# Patient Record
Sex: Male | Born: 1990 | Hispanic: Yes | Marital: Single | Smoking: Never smoker
Health system: Southern US, Community
[De-identification: ages and names within clinical notes are randomized; demographics above are authoritative.]

## PROBLEM LIST (undated history)

## (undated) DIAGNOSIS — Z227 Latent tuberculosis: Secondary | ICD-10-CM

## (undated) HISTORY — DX: Latent tuberculosis: Z22.7

---

## 2019-12-02 ENCOUNTER — Telehealth: Payer: Self-pay

## 2019-12-09 ENCOUNTER — Other Ambulatory Visit: Payer: Self-pay

## 2019-12-09 ENCOUNTER — Ambulatory Visit (LOCAL_COMMUNITY_HEALTH_CENTER): Payer: Self-pay

## 2019-12-09 VITALS — Wt 216.0 lb

## 2019-12-09 DIAGNOSIS — Z111 Encounter for screening for respiratory tuberculosis: Secondary | ICD-10-CM

## 2019-12-09 NOTE — Progress Notes (Signed)
Patient in clinic d/t + QFT with employer, [employer]. Per patient when he first came to the [place] he had a +TB test and took INH x 9 months. States he tried to tell employer this but they still tested him.   Patient has no records of this. EPI completed and referred for CXR. Richmond Campbell, RN

## 2019-12-10 NOTE — Telephone Encounter (Signed)
TC from patient.  Informed eval is needed for +QFT. Appt scheduled Richmond Campbell, RN

## 2019-12-11 ENCOUNTER — Ambulatory Visit
Admission: RE | Admit: 2019-12-11 | Discharge: 2019-12-11 | Disposition: A | Discharge status: Home / Self Care | Payer: Self-pay | Attending: Family Medicine | Admitting: Family Medicine

## 2019-12-11 ENCOUNTER — Other Ambulatory Visit: Payer: Self-pay | Admitting: Family Medicine

## 2019-12-11 ENCOUNTER — Ambulatory Visit
Admission: RE | Admit: 2019-12-11 | Discharge: 2019-12-11 | Disposition: A | Discharge status: Home / Self Care | Payer: Self-pay | Source: Ambulatory Visit | Attending: Family Medicine | Admitting: Family Medicine

## 2019-12-11 DIAGNOSIS — R7612 Nonspecific reaction to cell mediated immunity measurement of gamma interferon antigen response without active tuberculosis: Secondary | ICD-10-CM

## 2021-03-03 IMAGING — CR DG CHEST 1V
1 series · 1 of 1 positions shown · non-contrast
Comparison: None.

CLINICAL DATA: Positive QuantiFERON gold test

EXAM:
CHEST  1 VIEW

[dg chest 1 view]
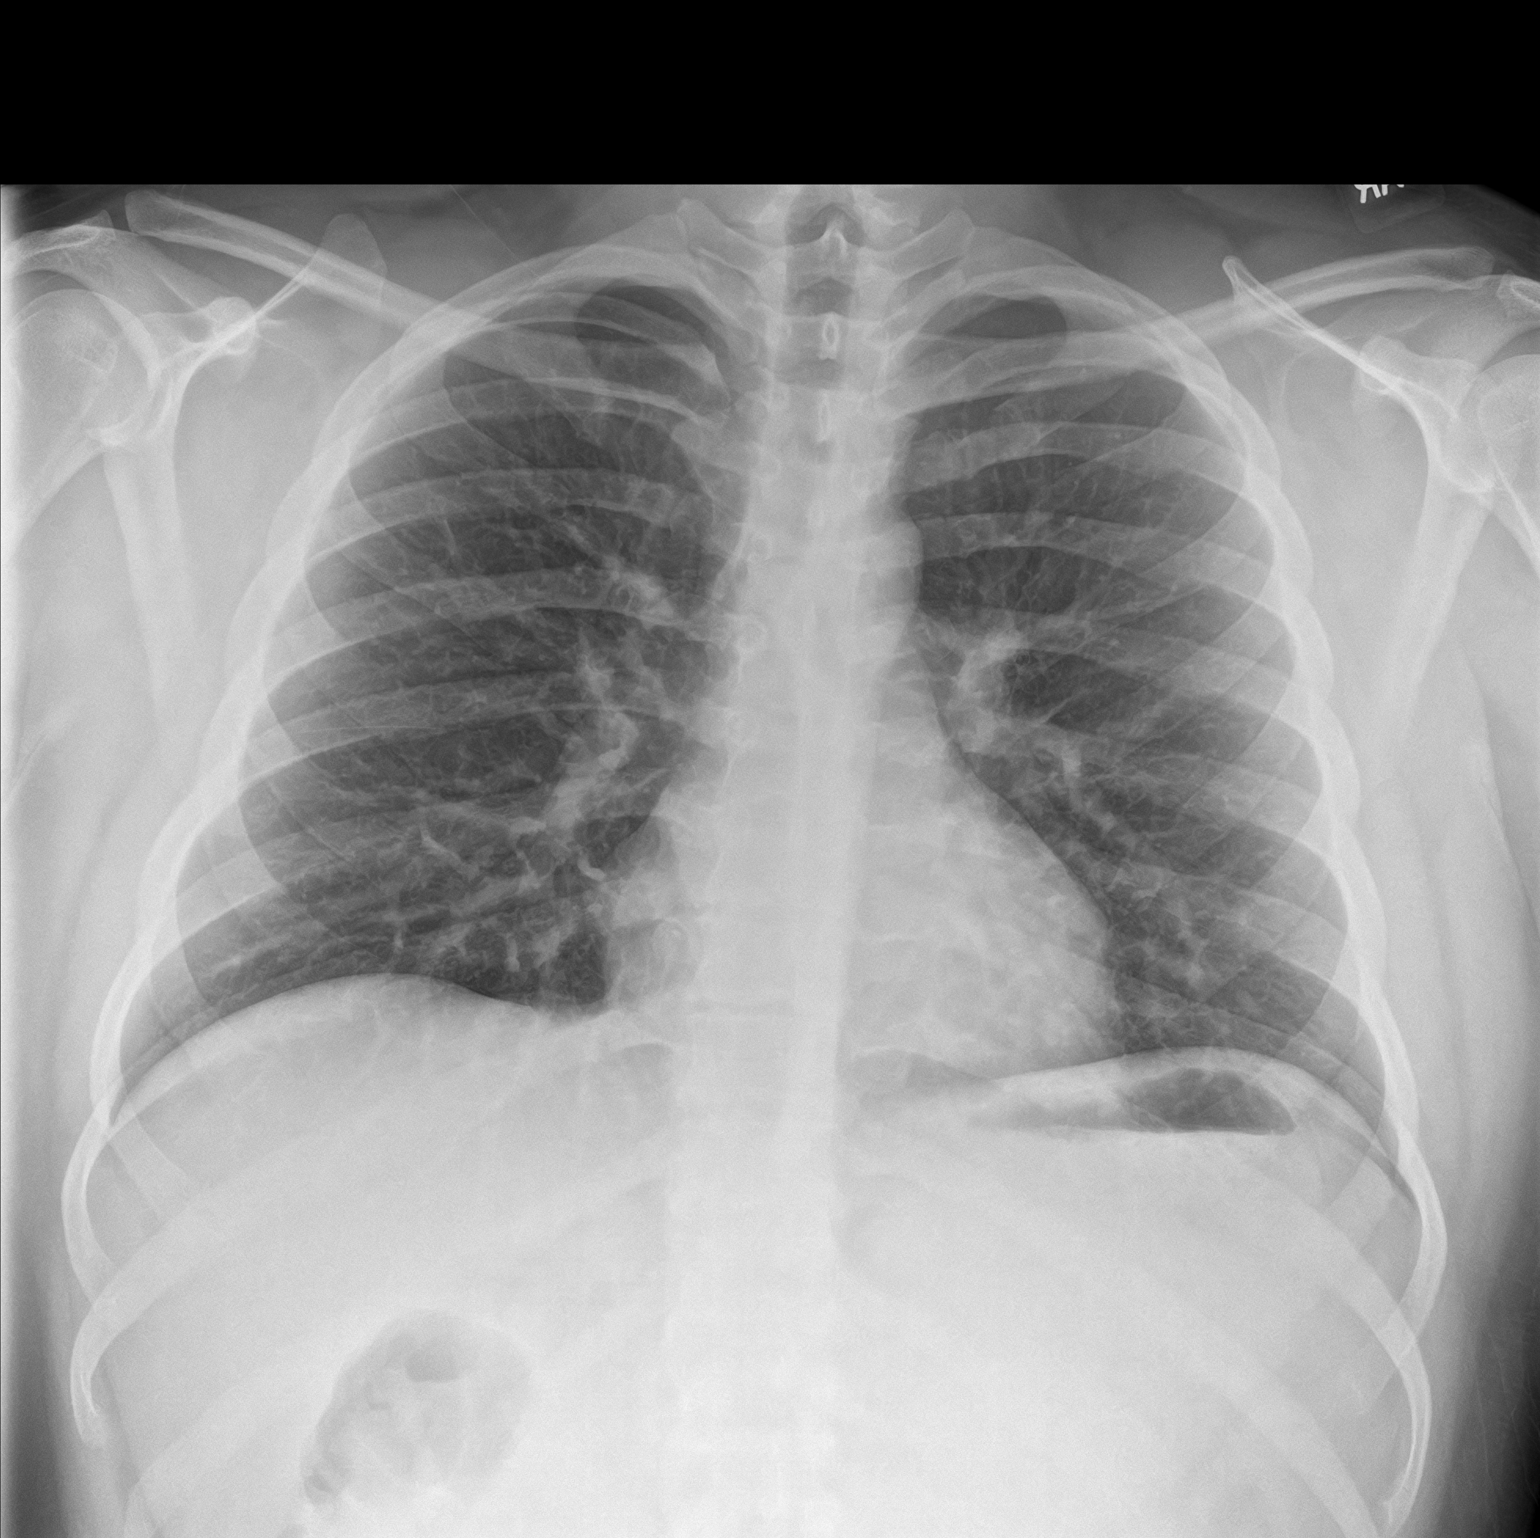

[1 of 1 positions shown; findings below may reference images not displayed]

FINDINGS: The heart size and mediastinal contours are within normal limits.
Both lungs are clear. The visualized skeletal structures are
unremarkable.
IMPRESSION: No active disease.

## 2024-04-30 ENCOUNTER — Ambulatory Visit (INDEPENDENT_AMBULATORY_CARE_PROVIDER_SITE_OTHER): Admitting: Family Medicine

## 2024-04-30 VITALS — BP 118/76 | HR 83 | Resp 16 | Ht 68.0 in | Wt 227.0 lb

## 2024-04-30 DIAGNOSIS — E669 Obesity, unspecified: Secondary | ICD-10-CM

## 2024-04-30 DIAGNOSIS — Z0001 Encounter for general adult medical examination with abnormal findings: Secondary | ICD-10-CM

## 2024-04-30 DIAGNOSIS — Z0000 Encounter for general adult medical examination without abnormal findings: Secondary | ICD-10-CM

## 2024-04-30 DIAGNOSIS — Z8611 Personal history of tuberculosis: Secondary | ICD-10-CM

## 2024-04-30 DIAGNOSIS — E66811 Obesity, class 1: Secondary | ICD-10-CM

## 2024-04-30 DIAGNOSIS — Z113 Encounter for screening for infections with a predominantly sexual mode of transmission: Secondary | ICD-10-CM

## 2024-04-30 DIAGNOSIS — Z13220 Encounter for screening for lipoid disorders: Secondary | ICD-10-CM

## 2024-04-30 DIAGNOSIS — B351 Tinea unguium: Secondary | ICD-10-CM

## 2024-04-30 NOTE — Assessment & Plan Note (Addendum)
 Hx of TB, diagnosed approximately 20 years ago and patient reports he underwent treatment. No other intervention needed at this time.

## 2024-04-30 NOTE — Progress Notes (Signed)
 "  New Patient Office Visit  Subjective    Patient ID: Aaron Payne, male    DOB: June 03, 1990  Age: 34 y.o. MRN: [id number]  CC:  Chief Complaint  Patient presents with   Establish Care    HPI Aaron Payne presents to establish care. He admits he has been without a primary care provider since teenage years. He does report hx of TB approximately 20 years ago and voices he was treated for this. Denies other pertinent medical hx .   He voices significant family hx of cancer including maternal grandfather with pancreatic cancer, paternal grandmother cervical cancer, and 3 paternal uncles who are deceased s/t pancreatic cancer. Offered referral to GI due to significant family hx of pancreatic cancer which he declines at this time. Denies GI symptoms including but not limited to nausea, vomiting, abdominal pain, diarrhea, constipation, blood in the stool, or heartburn. Denies fever, chills or unintentional weight loss. 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  He does voice family hx of schizophrenia affecting paternal aunt, however went undiagnosed for several years. He also voices his brother has been diagnosed with schizophrenia.  Family hx of glaucoma. He does wear corrective lenses and has annual eye exams. No acute vision changes, denies photophobia and eye pain as well.   He voices he went to [place] 6 months ago and voices he feels like he got a fungal infection while out of the country. Discolored thickened toenails, great toe and 5th toe of the left foot and 4th and 5th toes of the right foot. He states he had tried to use topical steroids and has seen some improvement in just the great toe of the left foot. No improvement in other toes.     Past Medical History:  Diagnosis Date   TB lung, latent    completed 9 months of INH per patient report    No past surgical history on file.  Family History  Problem  Relation Age of Onset   Varicose Veins Mother    Stroke Maternal Grandmother    Varicose Veins Maternal Grandmother    Anxiety disorder Brother    Early death Paternal Aunt    Early death Maternal Aunt     Social History   Socioeconomic History   Marital status: Single    Spouse name: Not on file   Number of children: Not on file   Years of education: Not on file   Highest education level: Bachelor's degree (e.g., BA, AB, BS)  Occupational History   Not on file  Tobacco Use   Smoking status: Never   Smokeless tobacco: Never  Vaping Use   Vaping status: Never Used  Substance and Sexual Activity   Alcohol use: Yes    Alcohol/week: 1.0 standard drink of alcohol    Types: 1 Glasses of wine per week   Drug use: Never   Sexual activity: Never  Other Topics Concern   Not on file  Social History Narrative   Not on file   Social Drivers of Health   Tobacco Use: Low Risk (04/30/2024)   Patient History    Smoking Tobacco Use: Never    Smokeless Tobacco Use: Never    Passive Exposure: Not on file  Financial Resource Strain: Patient Declined (04/30/2024)   Overall Financial Resource Strain (CARDIA)    Difficulty of Paying Living Expenses: Patient declined  Food Insecurity: Patient Declined (04/30/2024)   Epic  Worried About Programme Researcher, Broadcasting/film/video in the Last Year: Patient declined    Barista in the Last Year: Patient declined  Transportation Needs: Unmet Transportation Needs (04/30/2024)   Epic    Lack of Transportation (Medical): Yes    Lack of Transportation (Non-Medical): Yes  Physical Activity: Inactive (04/30/2024)   Exercise Vital Sign    Days of Exercise per Week: 0 days    Minutes of Exercise per Session: Not on file  Stress: Stress Concern Present (04/30/2024)   [employer] of Occupational Health - Occupational Stress Questionnaire    Feeling of Stress: To some extent  Social Connections: Socially Isolated (04/30/2024)   Social Connection and Isolation  Panel    Frequency of Communication with Friends and Family: Once a week    Frequency of Social Gatherings with Friends and Family: Once a week    Attends Religious Services: More than 4 times per year    Active Member of Clubs or Organizations: No    Attends Banker Meetings: Not on file    Marital Status: Never married  Intimate Partner Violence: Not At Risk (04/30/2024)   Epic    Fear of Current or Ex-Partner: No    Emotionally Abused: No    Physically Abused: No    Sexually Abused: No  Depression (PHQ2-9): Low Risk (04/30/2024)   Depression (PHQ2-9)    PHQ-2 Score: 0  Alcohol Screen: Low Risk (04/30/2024)   Alcohol Screen    Last Alcohol Screening Score (AUDIT): 1  Housing: High Risk (04/30/2024)   Epic    Unable to Pay for Housing in the Last Year: Yes    Number of Times Moved in the Last Year: 0    Homeless in the Last Year: Patient declined  Utilities: Patient Declined (04/30/2024)   Epic    Threatened with loss of utilities: Patient declined  Health Literacy: Adequate Health Literacy (04/30/2024)   B1300 Health Literacy    Frequency of need for help with medical instructions: Never    Review of Systems  Constitutional:  Positive for malaise/fatigue. Negative for chills, diaphoresis, fever and weight loss.  Eyes:  Negative for blurred vision, double vision, photophobia and pain.  Respiratory:  Negative for sputum production.   Cardiovascular:  Negative for chest pain and palpitations.  Gastrointestinal:  Negative for abdominal pain, blood in stool, constipation, diarrhea, heartburn, nausea and vomiting.  Psychiatric/Behavioral:  Negative for depression, hallucinations, memory loss and suicidal ideas. The patient is not nervous/anxious.         Objective    BP 118/76   Pulse 83   Resp 16   Ht 5' 8 (1.727 m)   Wt 227 lb (103 kg)   SpO2 99%   BMI 34.52 kg/m   Physical Exam Constitutional:      General: He is not in acute distress.    Appearance:  He is obese. He is not ill-appearing or toxic-appearing.  Neck:     Vascular: No carotid bruit.  Cardiovascular:     Rate and Rhythm: Normal rate and regular rhythm.     Heart sounds: Normal heart sounds.  Pulmonary:     Effort: Pulmonary effort is normal. No respiratory distress.     Breath sounds: Normal breath sounds. No wheezing, rhonchi or rales.  Skin:    General: Skin is warm and dry.  Neurological:     General: No focal deficit present.     Mental Status: He is alert.  Psychiatric:  Mood and Affect: Mood normal.        Behavior: Behavior normal.       Assessment & Plan:   Assessment & Plan Encounter for medical examination to establish care 34 year old male seen today to establish care after several years without routine medical follow up/care. Patient past medical hx and family hx addressed. Denies past surgical hx.  See HPI for details.   -Routine labs ordered -Patient advised to return in 1 month for annual physical  Orders:   Lipid Profile   CBC with Differential/Platelet   Comprehensive Metabolic Panel (CMET)  Onychomycosis Presentation consistent with onychomycosis. Discolored thickened toenails, great toe and 5th toe of the left foot and 4th and 5th toes of the right foot.  -Advised that patient needs to complete ordered labs prior to starting treatment, this includes CBC and CMP. He voices understanding and is agreeable.  -Terbinafine 250mg  daily, assess treatment efficacy at time of 1 month f/u  -Return in 1 month for f/u     Obesity (BMI 30-39.9) BMI 34.52. He voices he has been overweight for most of his life including childhood and now adulthood. He admits that dietary changes are needed, as well as improvement in sleep hygiene.   -Encouraged dietary changes and improved sleep hygiene -Return in 1 month for f/u  -Routine labs obtained  Orders:   CBC with Differential/Platelet   Comprehensive Metabolic Panel (CMET)  Screening examination  for STD (sexually transmitted disease) Patient agreeable to STD testing including HIV and Hepatitis C. Declines other STD testing including chlamydia, gonorrhea, or syphilis. He states he is not sexually active.  -Labs ordered Orders:   Hepatitis C Antibody   HIV antibody (with reflex)  Lipid screening No recent hx of lipid screening. Denies prior hx of dyslipidemia. BMI >30.   -Lipid panel ordered, requesting that lab is done fasting. He voices understanding and is agreeable.  -Return in 1 month for f/u and annual physical  Orders:   Lipid Profile  History of TB (tuberculosis) Hx of TB, diagnosed approximately 20 years ago and patient reports he underwent treatment. No other intervention needed at this time.         Return in about 1 month (around 05/31/2024) for annual physical .   LAYMON LOISE CORE, FNP   "

## 2024-04-30 NOTE — Patient Instructions (Addendum)
 SABRA

## 2024-06-01 ENCOUNTER — Encounter: Admitting: Family Medicine
